# Patient Record
Sex: Male | Born: 1970 | Hispanic: No | Marital: Single | Smoking: Never smoker
Health system: Southern US, Community
[De-identification: ages and names within clinical notes are randomized; demographics above are authoritative.]

## PROBLEM LIST (undated history)

## (undated) DIAGNOSIS — F419 Anxiety disorder, unspecified: Secondary | ICD-10-CM

## (undated) DIAGNOSIS — K219 Gastro-esophageal reflux disease without esophagitis: Secondary | ICD-10-CM

---

## 2016-08-23 ENCOUNTER — Other Ambulatory Visit: Payer: Self-pay | Admitting: Occupational Medicine

## 2016-08-23 ENCOUNTER — Ambulatory Visit: Payer: Self-pay

## 2016-08-23 DIAGNOSIS — G8929 Other chronic pain: Secondary | ICD-10-CM

## 2016-08-23 DIAGNOSIS — M545 Low back pain: Principal | ICD-10-CM

## 2016-08-25 ENCOUNTER — Emergency Department (HOSPITAL_BASED_OUTPATIENT_CLINIC_OR_DEPARTMENT_OTHER): Payer: Medicare Other

## 2016-08-25 ENCOUNTER — Encounter (HOSPITAL_BASED_OUTPATIENT_CLINIC_OR_DEPARTMENT_OTHER): Payer: Self-pay | Admitting: *Deleted

## 2016-08-25 ENCOUNTER — Emergency Department (HOSPITAL_BASED_OUTPATIENT_CLINIC_OR_DEPARTMENT_OTHER)
Admission: EM | Admit: 2016-08-25 | Discharge: 2016-08-25 | Disposition: A | Discharge status: Home / Self Care | Payer: Medicare Other | Attending: Emergency Medicine | Admitting: Emergency Medicine

## 2016-08-25 DIAGNOSIS — R091 Pleurisy: Secondary | ICD-10-CM | POA: Insufficient documentation

## 2016-08-25 DIAGNOSIS — R079 Chest pain, unspecified: Secondary | ICD-10-CM | POA: Diagnosis present

## 2016-08-25 DIAGNOSIS — F419 Anxiety disorder, unspecified: Secondary | ICD-10-CM | POA: Insufficient documentation

## 2016-08-25 HISTORY — DX: Anxiety disorder, unspecified: F41.9

## 2016-08-25 HISTORY — DX: Gastro-esophageal reflux disease without esophagitis: K21.9

## 2016-08-25 LAB — BASIC METABOLIC PANEL
Anion gap: 8 (ref 5–15)
BUN: 15 mg/dL (ref 6–20)
CALCIUM: 9.2 mg/dL (ref 8.9–10.3)
CO2: 23 mmol/L (ref 22–32)
CREATININE: 1.28 mg/dL — AB (ref 0.61–1.24)
Chloride: 107 mmol/L (ref 101–111)
GFR calc Af Amer: >60 mL/min (ref >60)
Glucose, Bld: 95 mg/dL (ref 65–99)
Potassium: 3.9 mmol/L (ref 3.5–5.1)
SODIUM: 138 mmol/L (ref 135–145)

## 2016-08-25 LAB — CBC WITH DIFFERENTIAL/PLATELET
BASOS ABS: 0 10*3/uL (ref 0.0–0.1)
BASOS PCT: 1 %
EOS ABS: 0.2 10*3/uL (ref 0.0–0.7)
EOS PCT: 4 %
HCT: 45.8 % (ref 39.0–52.0)
Hemoglobin: 16.3 g/dL (ref 13.0–17.0)
Lymphocytes Relative: 56 %
Lymphs Abs: 2.6 10*3/uL (ref 0.7–4.0)
MCH: 29.2 pg (ref 26.0–34.0)
MCHC: 35.6 g/dL (ref 30.0–36.0)
MCV: 82.1 fL (ref 78.0–100.0)
MONO ABS: 0.5 10*3/uL (ref 0.1–1.0)
Monocytes Relative: 10 %
Neutro Abs: 1.3 10*3/uL — ABNORMAL LOW (ref 1.7–7.7)
Neutrophils Relative %: 29 %
PLATELETS: 207 10*3/uL (ref 150–400)
RBC: 5.58 MIL/uL (ref 4.22–5.81)
RDW: 13 % (ref 11.5–15.5)
WBC: 4.6 10*3/uL (ref 4.0–10.5)

## 2016-08-25 LAB — D-DIMER, QUANTITATIVE: D-Dimer, Quant: <0.27 ug/mL-FEU (ref 0.00–0.50)

## 2016-08-25 LAB — TROPONIN I

## 2016-08-25 MED ORDER — IBUPROFEN 400 MG PO TABS
600.0000 mg | ORAL_TABLET | Freq: Once | ORAL | Status: AC
  Administered 2016-08-25: 600 mg via ORAL
  Filled 2016-08-25: qty 1

## 2016-08-25 MED ORDER — LORAZEPAM 0.5 MG PO TABS: 0.5000 mg | ORAL_TABLET | Freq: Three times a day (TID) | ORAL | 0 refills | Status: AC | PRN

## 2016-08-25 NOTE — ED Triage Notes (Signed)
Pt presents to the ED with c/o chest pain. States pain woke him from his sleep. Describes as a sharp pain.  On arrival pt starts to hyperventilating and unable to answer any of the questions. 02 sats 100% on RA. Sinus 84 on monitor. Resp at bedside. MD at bedside.

## 2016-08-25 NOTE — Discharge Instructions (Signed)
Substance Abuse Treatment Programs ° °Intensive Outpatient Programs °[facility]     °[address]      °[place], [place]                   °[phone]      ° °[facility] °[address] °[place], [place] °336-379-7146 ° °[facility]     °(Inpatient and outpatient)     °[address]           °336-832-9800   ° °[facility] °336-288-1484 (Suboxone and Methadone) ° °[address]      °[place], [place] [address]      °336-882-2125      ° °[address] °[place], [place] °852-3033 ° °[facility] (Outpatient/Inpatient, Chemical)    °(insurance only) [phone]      °       °[facility] (Groups & Residential) °[place], [place] °336-389-1413 ° °   °[facility]     °[address]     °[place], [place]      °336-389-1413      ° °[facility] (for caregivers and family) °[address] °[place], [place] °336-299-4655 ° ° ° ° ° °Residential Treatment Programs °[facility]      °[address], [place], [place] [address]  °([phone]) [phone]      ° °[facility] °[address] [place], [place] [address] °919-419-1059 ° °[facility]        °336-248-8914      ° °[facility] °1-800-659-3381 ° °[facility]             °[address]                                         °[place], [place]                                                °877-615-2722 or 336-784-9470                              ° °[facility] [place] °[address] °[place], [address] °1.877.941.8954 ° °[facility]    °[address]      °[place], [place]     °336-273-5306      ° °[facility] °[address] °, [place] °336-285-9073 ° °[facility]   °[address]     °[place], [place] [address]     °336-899-1550      °Admissions: 8am-3pm M-F ° °[facility] °[address] °[place],  [place] °336-227-7417 ° °[facility]: Residential Program (90 Days)   °[place], [place]      °336-725-8389 or 800-758-6077    ° °[facility] °[place],  °(Walk in Hours over the weekend or by referral) ° °[facility] °[address], [place],  [address] °(336) [phone] ° °Crisis Mobile: [facility]:  1-877-626-1772 (for crisis response 24 hours a day) °[facility] Hotline:      1-800-256-2452 °Outpatient Psychiatry and Counseling ° °[facility]: Mobile Crisis   Management 24 hours:  1-877-626-1772 ° °[facility] sliding scale fee and walk in schedule: M-F 8am-12pm/1pm-3pm °[address]  °[place], [place] [address] °336-387-6161 ° °[facility] °[address] °[place], [place] [address] °336-703-9650 ° °[facility] ([facility])- new patient walk-in appointments available Monday - Friday 8am -3pm.          °[address] °[place], [place] [address] °336-676-6840 or crisis line- 336-676-6905 ° °[facility] Intensive Outpatient Therapy Program °[address] °[place], [place] [address] °336-832-9804 ° °[facility]                  °Crisis Services      °336.641.4993      °[address]     °[place], [place] [address]                ° °[facility]   °[facility] °[phone] °[address] °[place], [place] [address] ° ° °[facility]          °[address],  °[place], [place] [address]       °(336) [phone] ° °[facility] °[address] °[place], [place] [address] °336-292-1510 ° °[facility]    °[address]    °[place], [place] [address]     °336-632-3505      ° °Parish McKinney, MD     °[address]     °[place] [address]     °336-282-1251     °  °[facility] °[address] °[place] [address] ° °[facility]     °[address]     °[place], [place]      °336-542-2076      ° °[facility] °Shamsher Ahluwalia, MD °[address] °[place], [place] [address] °336-420-9558 ° °[facility]     °[address]     °[place], [place] [address]     °336-274-1237      ° °[facility] °[address] °[place], [place] [address] °336-854-4450 °Resources for Temporary Residential Assistance/Crisis Centers ° °DAY CENTERS °[facility] °M-F 8am-3pm   °[address] [place], [place] [address]   336-332-0824 °Services include: laundry, barbering, support groups, case management, phone  & computer access, showers, AA/NA mtgs, mental health/substance abuse nurse, job skills class, disability information, VA assistance, spiritual classes, etc.  ° °HOMELESS SHELTERS ° °[facility]     °[facility]   °[address], [place]     °336.271.5959       °       °[facility] (women and children)       °[address] °[place], [place] [address] °336-275-0820 °[email or web address] for application and process °Application Required ° °[facility]   °[address]    °[place] [address]     °336.886.4922       °             °[facility] °[address] °[place], [place] [address] °336.273.5572 °336-235-0363(schedule application appt.) °Application Required ° °[facility] (women only)    °[address]     °[place],  [address]     °336-884-1039      °  Intake starts 6pm daily °Need valid ID, SSC, & Police report °[facility] [place] °[address] °[place], [place] °336-881-5420 °Application Required ° °[facility] (men only)     °[address]      °[place], [place]     °336.748.1962      ° °[facility] °(Pregnant women only) °[address] °[place], [place] °336-275-0206 ° °[facility]      °[address]      °[place], [place] [address]     °336-722-9951      °       °[facility] °[address] °[place], [place] °336-723-1848 °90 day commitment/SA/Application process ° °[facility](men only)     °[address]     °[place], [place]     °336-748-1962       °Check-in at 7pm     °       °[facility] °[address] °[place], [place] [address] °336-248-6684 °Men/Women/Women and Children must be there by 7 pm ° °[facility] °[place],  °336-722-8721                ° °

## 2016-08-25 NOTE — ED Notes (Signed)
Pt calm at present. Able to answer triage questions at this time.

## 2016-08-25 NOTE — ED Provider Notes (Signed)
[facility]Y [facility] Provider Note   CSN: [id number] Arrival date & time: 08/25/16  0422     History   Chief Complaint Chief Complaint  Patient presents with  . Chest Pain   LEVEL 5 CAVEAT DUE TO RESPIRATORY DISTRESS   HPI Darrell May is a 46 y.o. male.  The history is provided by the patient. The history is limited by the condition of the patient.  Chest Pain   This is a new problem. Episode onset: unknown. The problem occurs constantly. The problem has been rapidly worsening. The pain is present in the substernal region. The quality of the pain is described as sharp. Associated symptoms include shortness of breath.  Patient presents with chest pain He presents alone No details are known as patient is not speaking  Past Medical History:  Diagnosis Date  . Anxiety     There are no active problems to display for this patient.   History reviewed. No pertinent surgical history.     Home Medications    Prior to Admission medications   Medication Sig Start Date End Date Taking? Authorizing Provider  UNABLE TO FIND    Yes [provider]    Family History No family history on file.  Social History Social History  Substance Use Topics  . Smoking status: Not on file  . Smokeless tobacco: Not on file  . Alcohol use Not on file     Allergies   Patient has no allergy information on record.   Review of Systems Review of Systems  Unable to perform ROS: Severe respiratory distress  Respiratory: Positive for shortness of breath.   Cardiovascular: Positive for chest pain.     Physical Exam Updated Vital Signs There were no vitals taken for this visit.  Physical Exam  CONSTITUTIONAL: Anxious, clutching his chest, hyperventilating HEAD: Normocephalic/atraumatic EYES:  PERRL ENMT: Mucous membranes moist NECK: supple no meningeal signs SPINE/BACK:no cervical/thoracic tenderness CV: S1/S2 noted  LUNGS:tachypnea noted, pt is  hyperventilating, lung sounds clear ABDOMEN: soft, nontender  GU:no cva tenderness NEURO: Pt is awake/alert but not speaking EXTREMITIES: pulses normal/equal, full ROM, no calf tenderness SKIN: warm, color normal PSYCH: anxious ED Treatments / Results  Labs (all labs ordered are listed, but only abnormal results are displayed) Labs Reviewed  BASIC METABOLIC PANEL - Abnormal; Notable for the following:       Result Value   Creatinine, Ser 1.28 (*)    All other components within normal limits  CBC WITH DIFFERENTIAL/PLATELET - Abnormal; Notable for the following:    Neutro Abs 1.3 (*)    All other components within normal limits  TROPONIN I  D-DIMER, QUANTITATIVE (NOT AT [facility])    EKG  EKG Interpretation  Date/Time:  Sunday August 25 2016 04:29:53 EDT Ventricular Rate:  80 PR Interval:    QRS Duration: 92 QT Interval:  379 QTC Calculation: 438 R Axis:   5 Text Interpretation:  Sinus rhythm No previous ECGs available Confirmed by Zadie Rhine ([id number]) on 08/25/2016 4:43:06 AM       Radiology Dg Lumbar Spine Complete  Result Date: 08/23/2016 CLINICAL DATA:  Lumbago following lifting injury 6 weeks prior EXAM: LUMBAR SPINE - COMPLETE 4+ VIEW COMPARISON:  None. FINDINGS: Frontal lateral, spot lumbosacral lateral, and bilateral oblique views were obtained. The there are 5 non-rib-bearing lumbar type vertebral bodies. There is no fracture or spondylolisthesis. The disc spaces appear normal. There is facet osteoarthritic change on the right at L4-5 and L5-S1. 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 No appreciable disc space narrowing. No fracture or spondylolisthesis. Electronically Signed   By: Bretta BangWilliam  Woodruff III M.D.   On: 08/23/2016 15:18   Dg Chest Port 1 View  Result Date: 08/25/2016 CLINICAL DATA:  Awoke from sleep with chest pain. Shortness of breath. EXAM: PORTABLE CHEST 1 VIEW COMPARISON:  None. FINDINGS: Low lung volumes with  bronchovascular crowding. The cardiomediastinal contours are normal. Pulmonary vasculature is normal. No consolidation, pleural effusion, or pneumothorax. No acute osseous abnormalities are seen. IMPRESSION: Low lung volumes with bronchovascular crowding. Electronically Signed   By: Rubye OaksMelanie  Ehinger M.D.   On: 08/25/2016 05:09    Procedures Procedures (including critical care time)  Medications Ordered in ED Medications - No data to display   Initial Impression / Assessment and Plan / ED Course  I have reviewed the triage vital signs and the nursing notes.  Pertinent labs & imaging results that were available during my care of the patient were reviewed by me and considered in my medical decision making (see chart for details).     4:47 AM Pt presents with CP No details are known as pt is not speaking ,clutching his chest and hyperventilating Labs/imaging pending 5:41 AM Pt improved He is now speaking He reports he felt well recently, went to dinner in [place] with a friend.  After getting home he went to bed, he woke up with sharp chest pain, worse with breathing.  He was diaphoretic at that time He denies having this previously No h/o CAD/PE He is a nonsmoker No hemoptysis reported He reports recent long distance travel Will check d-dimer to r/o PE  6:49 AM Pt improved He is watching TV No hypoxia noted Vitals appropriate  Pulse ox >95% when I am in room  Strong suspicion for anxiety given his presentation/hyperventilation Pt reports he is a veteran and has had nightmares/night sweats for years but tonight seemed worse than previous times He is new to the area and has not established with local [facility] care Given outpatient resources Short course of ativan (Narcotic database reviewed and considered in decision making) He feels safe for discharge.  He denies SI    Final Clinical Impressions(s) / ED Diagnoses   Final diagnoses:  Pleurisy  Anxiety    New  Prescriptions New Prescriptions   LORAZEPAM (ATIVAN) 0.5 MG TABLET    Take 1 tablet (0.5 mg total) by mouth every 8 (eight) hours as needed for anxiety.     Zadie RhineWickline, Matisyn Cabeza, MD 08/25/16 [phone]

## 2016-08-25 NOTE — ED Notes (Signed)
Unable to complete triage at this time due to pt not answering questions and hyperventilating. 02 sats 100% on Non rebreather mask.

## 2017-11-24 IMAGING — DX DG CHEST 1V PORT
1 series · 1 of 1 positions shown · non-contrast
Comparison: None.

CLINICAL DATA: Awoke from sleep with chest pain. Shortness of
breath.

EXAM:
PORTABLE CHEST 1 VIEW

[chest ap]
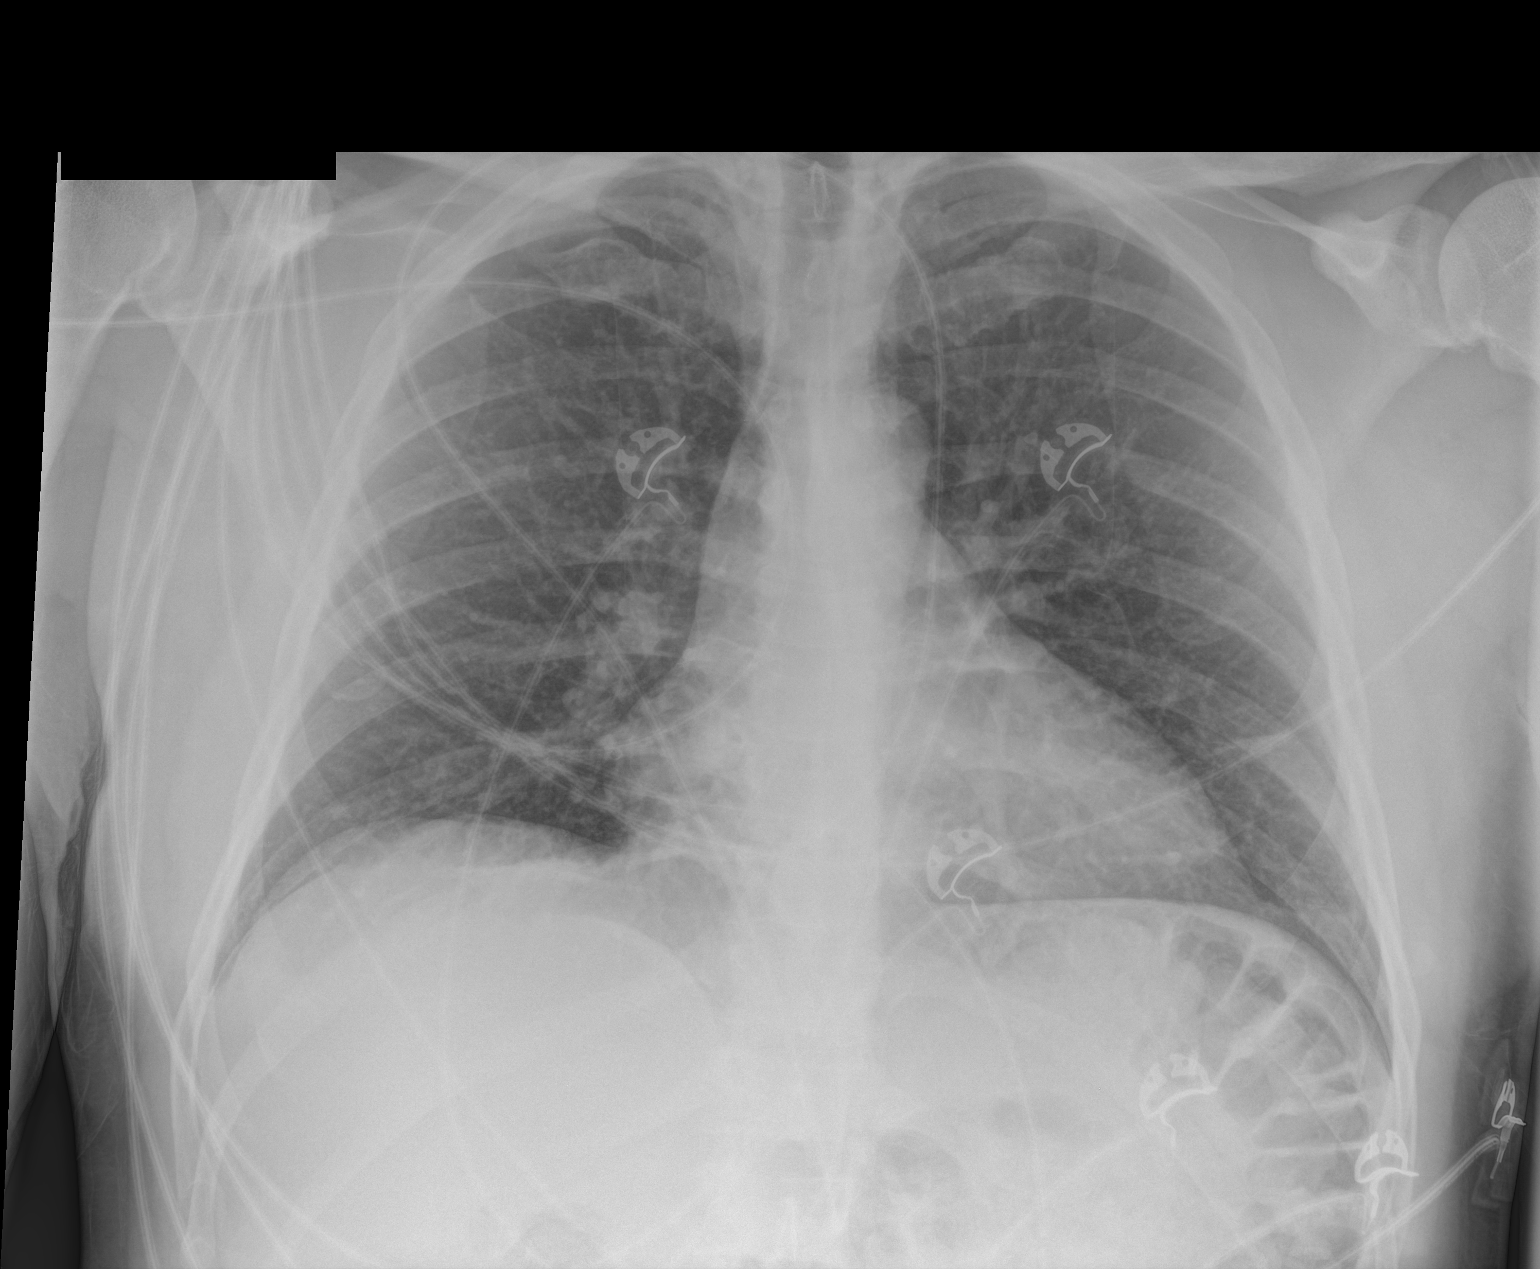

[1 of 1 positions shown; findings below may reference images not displayed]

FINDINGS: Low lung volumes with bronchovascular crowding. The
cardiomediastinal contours are normal. Pulmonary vasculature is
normal. No consolidation, pleural effusion, or pneumothorax. No
acute osseous abnormalities are seen.
IMPRESSION: Low lung volumes with bronchovascular crowding.
